# Patient Record
Sex: Male | Born: 1995 | Race: Black or African American | Hispanic: No | Marital: Single | Smoking: Current every day smoker
Health system: Southern US, Community
[De-identification: ages and names within clinical notes are randomized; demographics above are authoritative.]

## PROBLEM LIST (undated history)

## (undated) DIAGNOSIS — F419 Anxiety disorder, unspecified: Secondary | ICD-10-CM

## (undated) DIAGNOSIS — F32A Depression, unspecified: Secondary | ICD-10-CM

## (undated) DIAGNOSIS — F329 Major depressive disorder, single episode, unspecified: Secondary | ICD-10-CM

---

## 2016-06-22 ENCOUNTER — Emergency Department (HOSPITAL_COMMUNITY)
Admission: EM | Admit: 2016-06-22 | Discharge: 2016-06-22 | Disposition: A | Discharge status: Home / Self Care | Payer: Self-pay | Attending: Emergency Medicine | Admitting: Emergency Medicine

## 2016-06-22 ENCOUNTER — Encounter (HOSPITAL_COMMUNITY): Payer: Self-pay | Admitting: Emergency Medicine

## 2016-06-22 ENCOUNTER — Emergency Department (HOSPITAL_COMMUNITY): Payer: Self-pay

## 2016-06-22 DIAGNOSIS — S50811A Abrasion of right forearm, initial encounter: Secondary | ICD-10-CM | POA: Insufficient documentation

## 2016-06-22 DIAGNOSIS — M7918 Myalgia, other site: Secondary | ICD-10-CM

## 2016-06-22 DIAGNOSIS — F17200 Nicotine dependence, unspecified, uncomplicated: Secondary | ICD-10-CM | POA: Insufficient documentation

## 2016-06-22 DIAGNOSIS — Y999 Unspecified external cause status: Secondary | ICD-10-CM | POA: Insufficient documentation

## 2016-06-22 DIAGNOSIS — S40212A Abrasion of left shoulder, initial encounter: Secondary | ICD-10-CM | POA: Insufficient documentation

## 2016-06-22 DIAGNOSIS — S61512A Laceration without foreign body of left wrist, initial encounter: Secondary | ICD-10-CM | POA: Insufficient documentation

## 2016-06-22 DIAGNOSIS — Y92410 Unspecified street and highway as the place of occurrence of the external cause: Secondary | ICD-10-CM | POA: Insufficient documentation

## 2016-06-22 DIAGNOSIS — R51 Headache: Secondary | ICD-10-CM | POA: Insufficient documentation

## 2016-06-22 DIAGNOSIS — Y939 Activity, unspecified: Secondary | ICD-10-CM | POA: Insufficient documentation

## 2016-06-22 HISTORY — DX: Depression, unspecified: F32.A

## 2016-06-22 HISTORY — DX: Anxiety disorder, unspecified: F41.9

## 2016-06-22 HISTORY — DX: Major depressive disorder, single episode, unspecified: F32.9

## 2016-06-22 MED ORDER — LIDOCAINE HCL (PF) 1 % IJ SOLN
5.0000 mL | Freq: Once | INTRAMUSCULAR | Status: DC
  Filled 2016-06-22: qty 5

## 2016-06-22 MED ORDER — ACETAMINOPHEN 325 MG PO TABS
650.0000 mg | ORAL_TABLET | Freq: Once | ORAL | Status: AC
  Administered 2016-06-22: 650 mg via ORAL
  Filled 2016-06-22: qty 2

## 2016-06-22 MED ORDER — IBUPROFEN 600 MG PO TABS: 600.0000 mg | ORAL_TABLET | Freq: Four times a day (QID) | ORAL | 0 refills | Status: AC | PRN

## 2016-06-22 MED ORDER — IBUPROFEN 400 MG PO TABS
600.0000 mg | ORAL_TABLET | Freq: Once | ORAL | Status: AC
  Administered 2016-06-22: 10:00:00 600 mg via ORAL
  Filled 2016-06-22: qty 1
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
